# Patient Record
Sex: Male | Born: 1950
Health system: Southern US, Community
[De-identification: ages and names within clinical notes are randomized; demographics above are authoritative.]

## PROBLEM LIST (undated history)

## (undated) DIAGNOSIS — E291 Testicular hypofunction: Secondary | ICD-10-CM

## (undated) DIAGNOSIS — R319 Hematuria, unspecified: Secondary | ICD-10-CM

## (undated) DIAGNOSIS — N419 Inflammatory disease of prostate, unspecified: Secondary | ICD-10-CM

## (undated) DIAGNOSIS — K649 Unspecified hemorrhoids: Secondary | ICD-10-CM

## (undated) DIAGNOSIS — R972 Elevated prostate specific antigen [PSA]: Secondary | ICD-10-CM

## (undated) DIAGNOSIS — N400 Enlarged prostate without lower urinary tract symptoms: Secondary | ICD-10-CM

## (undated) DIAGNOSIS — N529 Male erectile dysfunction, unspecified: Secondary | ICD-10-CM

## (undated) DIAGNOSIS — E785 Hyperlipidemia, unspecified: Secondary | ICD-10-CM

## (undated) DIAGNOSIS — M545 Low back pain, unspecified: Secondary | ICD-10-CM

## (undated) DIAGNOSIS — R361 Hematospermia: Secondary | ICD-10-CM

## (undated) HISTORY — DX: Hyperlipidemia, unspecified: E78.5

## (undated) HISTORY — DX: Low back pain: M54.5

## (undated) HISTORY — DX: Hematuria, unspecified: R31.9

## (undated) HISTORY — DX: Hematospermia: R36.1

## (undated) HISTORY — DX: Unspecified hemorrhoids: K64.9

## (undated) HISTORY — PX: OTHER SURGICAL HISTORY: SHX169

## (undated) HISTORY — DX: Elevated prostate specific antigen (PSA): R97.20

## (undated) HISTORY — DX: Benign prostatic hyperplasia without lower urinary tract symptoms: N40.0

## (undated) HISTORY — DX: Testicular hypofunction: E29.1

## (undated) HISTORY — DX: Male erectile dysfunction, unspecified: N52.9

## (undated) HISTORY — DX: Low back pain, unspecified: M54.50

## (undated) HISTORY — PX: VASECTOMY: SHX75

## (undated) HISTORY — DX: Inflammatory disease of prostate, unspecified: N41.9

---

## 2007-04-08 ENCOUNTER — Emergency Department: Payer: Self-pay | Admitting: Unknown Physician Specialty

## 2007-04-08 ENCOUNTER — Other Ambulatory Visit: Payer: Self-pay

## 2007-04-08 ENCOUNTER — Ambulatory Visit: Payer: Self-pay | Admitting: Internal Medicine

## 2012-01-14 ENCOUNTER — Ambulatory Visit: Payer: Self-pay | Admitting: Cardiology

## 2012-02-05 DIAGNOSIS — E7800 Pure hypercholesterolemia, unspecified: Secondary | ICD-10-CM | POA: Insufficient documentation

## 2012-02-21 ENCOUNTER — Ambulatory Visit: Payer: Self-pay | Admitting: Cardiology

## 2014-03-25 ENCOUNTER — Ambulatory Visit: Payer: Self-pay | Admitting: Urology

## 2014-08-23 DIAGNOSIS — I4891 Unspecified atrial fibrillation: Secondary | ICD-10-CM | POA: Insufficient documentation

## 2015-05-09 DIAGNOSIS — H269 Unspecified cataract: Secondary | ICD-10-CM | POA: Diagnosis not present

## 2015-06-01 DIAGNOSIS — D2261 Melanocytic nevi of right upper limb, including shoulder: Secondary | ICD-10-CM | POA: Diagnosis not present

## 2015-06-01 DIAGNOSIS — L820 Inflamed seborrheic keratosis: Secondary | ICD-10-CM | POA: Diagnosis not present

## 2015-06-01 DIAGNOSIS — D225 Melanocytic nevi of trunk: Secondary | ICD-10-CM | POA: Diagnosis not present

## 2015-06-01 DIAGNOSIS — D2271 Melanocytic nevi of right lower limb, including hip: Secondary | ICD-10-CM | POA: Diagnosis not present

## 2015-06-01 DIAGNOSIS — L570 Actinic keratosis: Secondary | ICD-10-CM | POA: Diagnosis not present

## 2015-06-01 DIAGNOSIS — D2272 Melanocytic nevi of left lower limb, including hip: Secondary | ICD-10-CM | POA: Diagnosis not present

## 2015-06-01 DIAGNOSIS — D485 Neoplasm of uncertain behavior of skin: Secondary | ICD-10-CM | POA: Diagnosis not present

## 2015-07-17 DIAGNOSIS — I480 Paroxysmal atrial fibrillation: Secondary | ICD-10-CM | POA: Diagnosis not present

## 2015-07-17 DIAGNOSIS — E782 Mixed hyperlipidemia: Secondary | ICD-10-CM | POA: Diagnosis not present

## 2015-08-04 ENCOUNTER — Encounter: Payer: Self-pay | Admitting: *Deleted

## 2015-08-08 ENCOUNTER — Ambulatory Visit: Payer: Self-pay | Admitting: Urology

## 2015-08-10 ENCOUNTER — Encounter: Payer: Self-pay | Admitting: Urology

## 2015-08-10 ENCOUNTER — Ambulatory Visit (INDEPENDENT_AMBULATORY_CARE_PROVIDER_SITE_OTHER): Payer: Medicare Other | Admitting: Urology

## 2015-08-10 VITALS — BP 148/77 | HR 80 | Ht 70.0 in | Wt 192.2 lb

## 2015-08-10 DIAGNOSIS — R972 Elevated prostate specific antigen [PSA]: Secondary | ICD-10-CM | POA: Diagnosis not present

## 2015-08-10 DIAGNOSIS — N528 Other male erectile dysfunction: Secondary | ICD-10-CM

## 2015-08-10 DIAGNOSIS — N138 Other obstructive and reflux uropathy: Secondary | ICD-10-CM

## 2015-08-10 DIAGNOSIS — N529 Male erectile dysfunction, unspecified: Secondary | ICD-10-CM

## 2015-08-10 DIAGNOSIS — N401 Enlarged prostate with lower urinary tract symptoms: Secondary | ICD-10-CM | POA: Diagnosis not present

## 2015-08-10 MED ORDER — SILDENAFIL CITRATE 100 MG PO TABS: 100.0000 mg | ORAL_TABLET | Freq: Every day | ORAL | Status: DC | PRN

## 2015-08-10 NOTE — Progress Notes (Signed)
08/10/2015 12:15 PM   Kyle [name] 31-Oct-1950 [id number]  Referring provider: No referring provider defined for this encounter.  Chief Complaint  Patient presents with  . Erectile Dysfunction    yearly f/u    HPI: Patient is 65 year old Caucasian male with a history of erectile dysfunction and BPH with LUTS who presents today for yearly follow-up.  Erectile dysfunction His SHIM score is 10, which is moderate erectile dysfunction.   He has been having difficulty with erections for the last three years.   His major complaint is achieving an erection.  His libido is preserved.   His risk factors for ED are age, BPH and HTN.  He denies any painful erections or curvatures with his erections.   He has tried Viagra in the past with good results.  He would like to continue the medication.      SHIM      08/10/15 1450       SHIM: Over the last 6 months:   How do you rate your confidence that you could get and keep an erection? Very Low     When you had erections with sexual stimulation, how often were your erections hard enough for penetration (entering your partner)? A Few Times (much less than half the time)     During sexual intercourse, how often were you able to maintain your erection after you had penetrated (entered) your partner? Very Difficult     During sexual intercourse, how difficult was it to maintain your erection to completion of intercourse? Difficult     When you attempted sexual intercourse, how often was it satisfactory for you? Very Difficult     SHIM Total Score   SHIM 10        Score: 1-7 Severe ED 8-11 Moderate ED 12-16 Mild-Moderate ED 17-21 Mild ED 22-25 No ED   BPH WITH LUTS His IPSS score today is 1, which is mild lower urinary tract symptomatology. He is delighted with his quality life due to his urinary symptoms.   His major complaint today is urinary frequency, but he attributes this to an increase in water intake.   He has had these  symptoms for the last few months.  He denies any dysuria, hematuria or suprapubic pain.  He also denies any recent fevers, chills, nausea or vomiting.   He does not have a family history of PCa.      IPSS      08/10/15 1400       International Prostate Symptom Score   How often have you had the sensation of not emptying your bladder? Not at All     How often have you had to urinate less than every two hours? Less than 1 in 5 times     How often have you found you stopped and started again several times when you urinated? Not at All     How often have you found it difficult to postpone urination? Not at All     How often have you had a weak urinary stream? Not at All     How often have you had to strain to start urination? Not at All     How many times did you typically get up at night to urinate? None     Total IPSS Score 1     Quality of Life due to urinary symptoms   If you were to spend the rest of your life with your urinary condition just the way  it is now how would you feel about that? Delighted        Score:  1-7 Mild 8-19 Moderate 20-35 Severe    PMH: Past Medical History  Diagnosis Date  . HLD (hyperlipidemia)   . Hematuria   . Elevated PSA   . BPH (benign prostatic hyperplasia)   . Hemorrhoids   . ED (erectile dysfunction)   . Blood in semen   . Hypogonadism in male   . Prostatitis   . Back pain, lumbosacral     Surgical History: Past Surgical History  Procedure Laterality Date  . Cardiaac ablation    . Vasectomy      Home Medications:    Medication List       This list is accurate as of: 08/10/15 11:59 PM.  Always use your most recent med list.               aspirin EC 81 MG tablet  Take by mouth.     lovastatin 10 MG tablet  Commonly known as:  MEVACOR  Take by mouth.     metoprolol tartrate 25 MG tablet  Commonly known as:  LOPRESSOR  Take by mouth.     sildenafil 100 MG tablet  Commonly known as:  VIAGRA  Take 1 tablet (100 mg  total) by mouth daily as needed for erectile dysfunction.        Allergies: No Known Allergies  Family History: Family History  Problem Relation Age of Onset  . Benign prostatic hyperplasia Father   . Breast cancer Mother   . Prostate cancer Neg Hx     Social History:  reports that he has never smoked. He does not have any smokeless tobacco history on file. He reports that he does not drink alcohol or use illicit drugs.  ROS: UROLOGY Frequent Urination?: No Hard to postpone urination?: No Burning/pain with urination?: No Get up at night to urinate?: No Leakage of urine?: No Urine stream starts and stops?: No Trouble starting stream?: No Do you have to strain to urinate?: No Blood in urine?: No Urinary tract infection?: No Sexually transmitted disease?: No Injury to kidneys or bladder?: No Painful intercourse?: No Weak stream?: No Erection problems?: No Penile pain?: No  Gastrointestinal Nausea?: No Vomiting?: No Indigestion/heartburn?: No Diarrhea?: No Constipation?: No  Constitutional Fever: No Night sweats?: No Weight loss?: No Fatigue?: No  Skin Skin rash/lesions?: No Itching?: No  Eyes Blurred vision?: No Double vision?: No  Ears/Nose/Throat Sore throat?: No Sinus problems?: No  Hematologic/Lymphatic Swollen glands?: No Easy bruising?: No  Cardiovascular Leg swelling?: No Chest pain?: No  Respiratory Cough?: No Shortness of breath?: No  Endocrine Excessive thirst?: No  Musculoskeletal Back pain?: No Joint pain?: No  Neurological Headaches?: No Dizziness?: No  Psychologic Depression?: No Anxiety?: No  Physical Exam: BP 148/77 mmHg  Pulse 80  Ht 5\' 10"  (1.778 m)  Wt 192 lb 3.2 oz (87.181 kg)  BMI 27.58 kg/m2  Constitutional: Well nourished. Alert and oriented, No acute distress. HEENT: [place] AT, moist mucus membranes. Trachea midline, no masses. Cardiovascular: No clubbing, cyanosis, or edema. Respiratory: Normal  respiratory effort, no increased work of breathing. GI: Abdomen is soft, non tender, non distended, no abdominal masses. Liver and spleen not palpable.  No hernias appreciated.  Stool sample for occult testing is not indicated.   GU: No CVA tenderness.  No bladder fullness or masses.  Patient with uncircumcised phallus. Foreskin not easily retracted  Urethral meatus is patent.  No  penile discharge. No penile lesions or rashes. Scrotum without lesions, cysts, rashes and/or edema.  Testicles are located scrotally bilaterally. No masses are appreciated in the testicles. Left and right epididymis are normal. Rectal: Patient with  normal sphincter tone. Anus and perineum without scarring or rashes. No rectal masses are appreciated. Prostate is approximately 60 grams, no nodules are appreciated. Seminal vesicles are normal. Skin: No rashes, bruises or suspicious lesions. Lymph: No cervical or inguinal adenopathy. Neurologic: Grossly intact, no focal deficits, moving all 4 extremities. Psychiatric: Normal mood and affect.  Laboratory Data: PSA History  1.4 ng/mL on 05/22/2012  1.5 ng/mL on 06/16/2013  1.6 ng/mL on 02/22/2014  1.7 ng/mL on 08/30/2014   Assessment & Plan:    1. BPH with LUTS:   IPSS score is 1/0.  We will continue to monitor.  He will RTC in one year for IPSS score, exam and PSA.  - PSA  2. Erectile dysfunction of organic origin:    SHIM score is 10.  He has had good success with the medication in the past.  I have given him Viagra 100 mg samples.  If they are still effective, he will call for a prescription.  I have also given him a coupon to help offset the cost.  He will RTC in one year for a SHIM score and exam.    Return in about 1 year (around 08/09/2016) for IPSS score and exam.  These notes generated with voice recognition software. I apologize for typographical errors.  Zara Council, [facility] [address], [place], [place] [address] [phone]

## 2015-08-11 ENCOUNTER — Telehealth: Payer: Self-pay

## 2015-08-11 DIAGNOSIS — N401 Enlarged prostate with lower urinary tract symptoms: Principal | ICD-10-CM

## 2015-08-11 DIAGNOSIS — N138 Other obstructive and reflux uropathy: Secondary | ICD-10-CM | POA: Insufficient documentation

## 2015-08-11 LAB — PSA: PROSTATE SPECIFIC AG, SERUM: 2.1 ng/mL (ref 0.0–4.0)

## 2015-08-11 NOTE — Telephone Encounter (Signed)
-----   Message from Nori Riis, PA-C sent at 08/11/2015  9:21 AM EST ----- PSA is stable.  We will see him in one year.

## 2015-08-11 NOTE — Telephone Encounter (Signed)
Spoke with pt and made aware of PSA results. Pt voiced understanding.  

## 2015-08-31 ENCOUNTER — Ambulatory Visit: Payer: Self-pay | Admitting: Urology

## 2016-05-09 DIAGNOSIS — Z23 Encounter for immunization: Secondary | ICD-10-CM | POA: Diagnosis not present

## 2016-05-31 DIAGNOSIS — D1801 Hemangioma of skin and subcutaneous tissue: Secondary | ICD-10-CM | POA: Diagnosis not present

## 2016-07-17 DIAGNOSIS — I4891 Unspecified atrial fibrillation: Secondary | ICD-10-CM | POA: Diagnosis not present

## 2016-07-17 DIAGNOSIS — I480 Paroxysmal atrial fibrillation: Secondary | ICD-10-CM | POA: Diagnosis not present

## 2016-07-17 DIAGNOSIS — E7800 Pure hypercholesterolemia, unspecified: Secondary | ICD-10-CM | POA: Diagnosis not present

## 2016-07-17 DIAGNOSIS — K649 Unspecified hemorrhoids: Secondary | ICD-10-CM | POA: Diagnosis not present

## 2016-08-05 ENCOUNTER — Other Ambulatory Visit: Payer: Self-pay

## 2016-08-05 ENCOUNTER — Other Ambulatory Visit: Payer: Medicare Other

## 2016-08-05 DIAGNOSIS — N401 Enlarged prostate with lower urinary tract symptoms: Secondary | ICD-10-CM

## 2016-08-06 LAB — PSA: Prostate Specific Ag, Serum: 1.6 ng/mL (ref 0.0–4.0)

## 2016-08-12 ENCOUNTER — Ambulatory Visit (INDEPENDENT_AMBULATORY_CARE_PROVIDER_SITE_OTHER): Payer: Medicare Other | Admitting: Urology

## 2016-08-12 ENCOUNTER — Encounter: Payer: Self-pay | Admitting: Urology

## 2016-08-12 VITALS — BP 149/78 | HR 69 | Ht 70.0 in | Wt 187.9 lb

## 2016-08-12 DIAGNOSIS — N401 Enlarged prostate with lower urinary tract symptoms: Secondary | ICD-10-CM | POA: Diagnosis not present

## 2016-08-12 DIAGNOSIS — N529 Male erectile dysfunction, unspecified: Secondary | ICD-10-CM | POA: Diagnosis not present

## 2016-08-12 DIAGNOSIS — N138 Other obstructive and reflux uropathy: Secondary | ICD-10-CM

## 2016-08-12 MED ORDER — SILDENAFIL CITRATE 100 MG PO TABS: 100.0000 mg | ORAL_TABLET | Freq: Every day | ORAL | 12 refills | Status: DC | PRN

## 2016-08-12 NOTE — Progress Notes (Signed)
08/12/2016 1:56 PM   Kyle [name] 05-Sep-1950 [id number]  Referring provider: Sherrin Daisy, MD [place], [place] [id number]  Chief Complaint  Patient presents with  . Benign Prostatic Hypertrophy    1 year follow up   . Erectile Dysfunction    HPI: Patient is 66 year old Caucasian male with erectile dysfunction and BPH with LUTS who presents today for yearly follow-up.  Erectile dysfunction His SHIM score is 18, which is mild erectile dysfunction.   His previous SHIM score was 10.  He has been having difficulty with erections for the last three years.   His major complaint is achieving an erection.  His libido is preserved.   His risk factors for ED are age, BPH and HTN.  He denies any painful erections or curvatures with his erections.   He has tried Viagra in the past with good results.  He would like to continue the medication.      SHIM    Row Name 08/12/16 1345         SHIM: Over the last 6 months:   How do you rate your confidence that you could get and keep an erection? Low     When you had erections with sexual stimulation, how often were your erections hard enough for penetration (entering your partner)? Most Times (much more than half the time)     During sexual intercourse, how often were you able to maintain your erection after you had penetrated (entered) your partner? Slightly Difficult     During sexual intercourse, how difficult was it to maintain your erection to completion of intercourse? Slightly Difficult     When you attempted sexual intercourse, how often was it satisfactory for you? Slightly Difficult       SHIM Total Score   SHIM 18        Score: 1-7 Severe ED 8-11 Moderate ED 12-16 Mild-Moderate ED 17-21 Mild ED 22-25 No ED   BPH WITH LUTS His IPSS score today is 0, which is no lower urinary tract symptomatology. He is delighted with his quality life due to his urinary symptoms.  His previous I PSS score was 1/0.  He has  no urinary complaints at this time.  He denies any dysuria, hematuria or suprapubic pain.  He also denies any recent fevers, chills, nausea or vomiting.   He does not have a family history of PCa.      IPSS    Row Name 08/12/16 1300         International Prostate Symptom Score   How often have you had the sensation of not emptying your bladder? Not at All     How often have you had to urinate less than every two hours? Not at All     How often have you found you stopped and started again several times when you urinated? Not at All     How often have you found it difficult to postpone urination? Not at All     How often have you had a weak urinary stream? Not at All     How often have you had to strain to start urination? Not at All     How many times did you typically get up at night to urinate? None     Total IPSS Score 0       Quality of Life due to urinary symptoms   If you were to spend the rest of your life with your  urinary condition just the way it is now how would you feel about that? Delighted        Score:  1-7 Mild 8-19 Moderate 20-35 Severe    PMH: Past Medical History:  Diagnosis Date  . Back pain, lumbosacral   . Blood in semen   . BPH (benign prostatic hyperplasia)   . ED (erectile dysfunction)   . Elevated PSA   . Hematuria   . Hemorrhoids   . HLD (hyperlipidemia)   . Hypogonadism in male   . Prostatitis     Surgical History: Past Surgical History:  Procedure Laterality Date  . cardiaac ablation    . VASECTOMY      Home Medications:  Allergies as of 08/12/2016   No Known Allergies     Medication List       Accurate as of 08/12/16  1:56 PM. Always use your most recent med list.          aspirin EC 81 MG tablet Take by mouth.   lovastatin 10 MG tablet Commonly known as:  MEVACOR Take by mouth.   lovastatin 10 MG tablet Commonly known as:  MEVACOR Take by mouth.   metoprolol tartrate 25 MG tablet Commonly known as:   LOPRESSOR Take by mouth.   sildenafil 100 MG tablet Commonly known as:  VIAGRA Take 1 tablet (100 mg total) by mouth daily as needed for erectile dysfunction.       Allergies: No Known Allergies  Family History: Family History  Problem Relation Age of Onset  . Benign prostatic hyperplasia Father   . Breast cancer Mother   . Prostate cancer Neg Hx   . Kidney disease Neg Hx   . Bladder Cancer Neg Hx     Social History:  reports that he has never smoked. He has never used smokeless tobacco. He reports that he does not drink alcohol or use drugs.  ROS: UROLOGY Frequent Urination?: No Hard to postpone urination?: No Burning/pain with urination?: No Get up at night to urinate?: No Leakage of urine?: No Urine stream starts and stops?: No Trouble starting stream?: No Do you have to strain to urinate?: No Blood in urine?: No Urinary tract infection?: No Sexually transmitted disease?: No Injury to kidneys or bladder?: No Painful intercourse?: No Weak stream?: No Erection problems?: Yes Penile pain?: No  Gastrointestinal Nausea?: No Vomiting?: No Indigestion/heartburn?: No Diarrhea?: No Constipation?: No  Constitutional Fever: No Night sweats?: No Weight loss?: No Fatigue?: No  Skin Skin rash/lesions?: No Itching?: No  Eyes Blurred vision?: No Double vision?: No  Ears/Nose/Throat Sore throat?: No Sinus problems?: No  Hematologic/Lymphatic Swollen glands?: No Easy bruising?: No  Cardiovascular Leg swelling?: No Chest pain?: No  Respiratory Cough?: No Shortness of breath?: No  Endocrine Excessive thirst?: No  Musculoskeletal Back pain?: No Joint pain?: No  Neurological Headaches?: No Dizziness?: No  Psychologic Depression?: No Anxiety?: No  Physical Exam: BP (!) 149/78   Pulse 69   Ht 5\' 10"  (1.778 m)   Wt 187 lb 14.4 oz (85.2 kg)   BMI 26.96 kg/m   Constitutional: Well nourished. Alert and oriented, No acute distress. HEENT:  [place] AT, moist mucus membranes. Trachea midline, no masses. Cardiovascular: No clubbing, cyanosis, or edema. Respiratory: Normal respiratory effort, no increased work of breathing. GI: Abdomen is soft, non tender, non distended, no abdominal masses. Liver and spleen not palpable.  No hernias appreciated.  Stool sample for occult testing is not indicated.   GU: No CVA tenderness.  No bladder fullness or masses.  Patient with uncircumcised phallus. Foreskin not easily retracted  Urethral meatus is patent.  No penile discharge. No penile lesions or rashes. Scrotum without lesions, cysts, rashes and/or edema.  Testicles are located scrotally bilaterally. No masses are appreciated in the testicles. Left and right epididymis are normal. Rectal: Patient with  normal sphincter tone. Anus and perineum without scarring or rashes. No rectal masses are appreciated. Prostate is approximately 60 grams, no nodules are appreciated. Seminal vesicles are normal. Skin: No rashes, bruises or suspicious lesions. Lymph: No cervical or inguinal adenopathy. Neurologic: Grossly intact, no focal deficits, moving all 4 extremities. Psychiatric: Normal mood and affect.  Laboratory Data: PSA History  1.4 ng/mL on 05/22/2012  1.5 ng/mL on 06/16/2013  1.6 ng/mL on 02/22/2014  1.7 ng/mL on 08/30/2014  2.1 ng/mL on 08/10/2015  1.6 ng/mL on 08/05/2016   Assessment & Plan:    1. BPH with LUTS  - IPSS score is 0/0, it is improving  - Continue conservative management, avoiding bladder irritants and timed voiding's  - RTC in 12 months for IPSS, PSA and exam   2. Erectile dysfunction of organic origin:    SHIM score is 18, it is improving   Good success with Viagra.  Script called to pharmacy.  He will RTC in one year for a SHIM score and exam.    Return in about 1 year (around 08/12/2017) for IPSS, SHIM, PSA and exam.  These notes generated with voice recognition software. I apologize for typographical errors.  Zara Council, [facility] [address] [place], [place] [address] [phone]

## 2016-08-12 NOTE — Progress Notes (Signed)
bph

## 2017-05-07 DIAGNOSIS — Z23 Encounter for immunization: Secondary | ICD-10-CM | POA: Diagnosis not present

## 2017-05-30 DIAGNOSIS — D2271 Melanocytic nevi of right lower limb, including hip: Secondary | ICD-10-CM | POA: Diagnosis not present

## 2017-05-30 DIAGNOSIS — D225 Melanocytic nevi of trunk: Secondary | ICD-10-CM | POA: Diagnosis not present

## 2017-05-30 DIAGNOSIS — D2262 Melanocytic nevi of left upper limb, including shoulder: Secondary | ICD-10-CM | POA: Diagnosis not present

## 2017-05-30 DIAGNOSIS — L218 Other seborrheic dermatitis: Secondary | ICD-10-CM | POA: Diagnosis not present

## 2017-07-02 DIAGNOSIS — Z9989 Dependence on other enabling machines and devices: Secondary | ICD-10-CM | POA: Diagnosis not present

## 2017-07-02 DIAGNOSIS — G4733 Obstructive sleep apnea (adult) (pediatric): Secondary | ICD-10-CM | POA: Diagnosis not present

## 2017-07-02 DIAGNOSIS — E7849 Other hyperlipidemia: Secondary | ICD-10-CM | POA: Diagnosis not present

## 2017-07-02 DIAGNOSIS — E7800 Pure hypercholesterolemia, unspecified: Secondary | ICD-10-CM | POA: Diagnosis not present

## 2017-07-02 DIAGNOSIS — I480 Paroxysmal atrial fibrillation: Secondary | ICD-10-CM | POA: Diagnosis not present

## 2017-07-02 DIAGNOSIS — I4891 Unspecified atrial fibrillation: Secondary | ICD-10-CM | POA: Diagnosis not present

## 2017-08-12 ENCOUNTER — Ambulatory Visit: Payer: Medicare Other | Admitting: Urology

## 2017-08-13 ENCOUNTER — Other Ambulatory Visit: Payer: Medicare Other

## 2017-08-13 DIAGNOSIS — N401 Enlarged prostate with lower urinary tract symptoms: Secondary | ICD-10-CM

## 2017-08-14 ENCOUNTER — Other Ambulatory Visit: Payer: Medicare Other

## 2017-08-14 LAB — PSA: Prostate Specific Ag, Serum: 1.9 ng/mL (ref 0.0–4.0)

## 2017-08-17 NOTE — Progress Notes (Signed)
08/18/2017 11:56 PM   Kyle [name] Aug 21, 1950 [id number]  Referring provider: Sherrin Daisy, MD No address on file  Chief Complaint  Patient presents with  . Benign Prostatic Hypertrophy    HPI: Patient is 67 year old Caucasian male with erectile dysfunction and BPH with LUTS who presents today for yearly follow-up.  Erectile dysfunction His SHIM score is 13, which is mild to moderate erectile dysfunction.   His previous SHIM score was 18.  He has been having difficulty with erections for the last four years.   His major complaint is achieving an erection.  His libido is preserved.   His risk factors for ED are age, BPH and HTN.  He denies any painful erections or curvatures with his erections.   He has noted that over the last 6 months there has been a decrease in the efficacy of the Viagra.  He does have sleep apnea and is sleeping with a CPAP machine.  SHIM    Row Name 08/18/17 1447         SHIM: Over the last 6 months:   How do you rate your confidence that you could get and keep an erection?  Low     When you had erections with sexual stimulation, how often were your erections hard enough for penetration (entering your partner)?  Sometimes (about half the time)     During sexual intercourse, how often were you able to maintain your erection after you had penetrated (entered) your partner?  Almost Always or Always     During sexual intercourse, how difficult was it to maintain your erection to completion of intercourse?  Not Difficult     When you attempted sexual intercourse, how often was it satisfactory for you?  Sometimes (about half the time)       SHIM Total Score   SHIM  18        Score: 1-7 Severe ED 8-11 Moderate ED 12-16 Mild-Moderate ED 17-21 Mild ED 22-25 No ED   BPH WITH LUTS His IPSS score today is 1, which is mild lower urinary tract symptomatology. He is delighted with his quality life due to his urinary symptoms.  His previous I PSS score  was 0/0.  He has no urinary complaints [name] this time.  He denies any dysuria, hematuria or suprapubic pain.  He also denies any recent fevers, chills, nausea or vomiting.   He does not have a family history of PCa.  IPSS    Row Name 08/18/17 1400         International Prostate Symptom Score   How often have you had the sensation of not emptying your bladder?  Not [name] All     How often have you had to urinate less than every two hours?  Not [name] All     How often have you found you stopped and started again several times when you urinated?  Not [name] All     How often have you found it difficult to postpone urination?  Not [name] All     How often have you had a weak urinary stream?  Not [name] All     How often have you had to strain to start urination?  Not [name] All     How many times did you typically get up [name] night to urinate?  1 Time     Total IPSS Score  1       Quality of Life due to urinary symptoms   If  you were to spend the rest of your life with your urinary condition just the way it is now how would you feel about that?  Delighted        Score:  1-7 Mild 8-19 Moderate 20-35 Severe    PMH: Past Medical History:  Diagnosis Date  . Back pain, lumbosacral   . Blood in semen   . BPH (benign prostatic hyperplasia)   . ED (erectile dysfunction)   . Elevated PSA   . Hematuria   . Hemorrhoids   . HLD (hyperlipidemia)   . Hypogonadism in male   . Prostatitis     Surgical History: Past Surgical History:  Procedure Laterality Date  . cardiaac ablation    . VASECTOMY      Home Medications:  Allergies as of 08/18/2017   No Known Allergies     Medication List        Accurate as of 08/18/17 11:59 PM. Always use your most recent med list.          aspirin EC 81 MG tablet Take by mouth.   lovastatin 10 MG tablet Commonly known as:  MEVACOR Take by mouth.   lovastatin 10 MG tablet Commonly known as:  MEVACOR Take by mouth.   metoprolol tartrate 25 MG tablet Commonly  known as:  LOPRESSOR Take by mouth.   sildenafil 100 MG tablet Commonly known as:  VIAGRA Take 1 tablet (100 mg total) by mouth daily as needed for erectile dysfunction.   tadalafil 20 MG tablet Commonly known as:  CIALIS Take 1 tablet (20 mg total) by mouth daily as needed for erectile dysfunction.       Allergies: No Known Allergies  Family History: Family History  Problem Relation Age of Onset  . Benign prostatic hyperplasia Father   . Breast cancer Mother   . Prostate cancer Neg Hx   . Kidney disease Neg Hx   . Bladder Cancer Neg Hx     Social History:  reports that  has never smoked. he has never used smokeless tobacco. He reports that he does not drink alcohol or use drugs.  ROS: UROLOGY Frequent Urination?: No Hard to postpone urination?: No Burning/pain with urination?: No Get up [name] night to urinate?: No Leakage of urine?: No Urine stream starts and stops?: No Trouble starting stream?: No Do you have to strain to urinate?: No Blood in urine?: No Urinary tract infection?: No Sexually transmitted disease?: No Injury to kidneys or bladder?: No Painful intercourse?: No Weak stream?: No Erection problems?: Yes Penile pain?: No  Gastrointestinal Nausea?: No Vomiting?: No Indigestion/heartburn?: No Diarrhea?: No Constipation?: No  Constitutional Fever: No Night sweats?: No Weight loss?: No Fatigue?: No  Skin Skin rash/lesions?: No Itching?: No  Eyes Blurred vision?: No Double vision?: No  Ears/Nose/Throat Sore throat?: No Sinus problems?: No  Hematologic/Lymphatic Swollen glands?: No Easy bruising?: No  Cardiovascular Leg swelling?: No Chest pain?: No  Respiratory Cough?: No Shortness of breath?: No  Endocrine Excessive thirst?: No  Musculoskeletal Back pain?: No Joint pain?: No  Neurological Headaches?: No Dizziness?: No  Psychologic Depression?: No Anxiety?: No  Physical Exam: BP (!) 145/80 (BP Location: Right Arm,  Patient Position: Sitting, Cuff Size: Normal)   Pulse 75   Wt 190 lb 3.2 oz (86.3 kg)   BMI 27.29 kg/m   Constitutional: Well nourished. Alert and oriented, No acute distress. HEENT: Kyle [name], moist mucus membranes. Trachea midline, no masses. Cardiovascular: No clubbing, cyanosis, or edema. Respiratory: Normal respiratory effort,  no increased work of breathing. GI: Abdomen is soft, non tender, non distended, no abdominal masses. Liver and spleen not palpable.  No hernias appreciated.  Stool sample for occult testing is not indicated.   GU: No CVA tenderness.  No bladder fullness or masses.  Patient with uncircumcised phallus.  Foreskin easily retracted Urethral meatus is patent.  No penile discharge. No penile lesions or rashes. Scrotum without lesions, cysts, rashes and/or edema.  Testicles are located scrotally bilaterally. No masses are appreciated in the testicles. Left and right epididymis are normal. Rectal: Patient with  normal sphincter tone. Anus and perineum without scarring or rashes. No rectal masses are appreciated. Prostate is approximately 60 grams, no nodules are appreciated. Seminal vesicles are normal. Skin: No rashes, bruises or suspicious lesions. Lymph: No cervical or inguinal adenopathy. Neurologic: Grossly intact, no focal deficits, moving all 4 extremities. Psychiatric: Normal mood and affect.   Laboratory Data: PSA History  1.4 ng/mL on 05/22/2012  1.5 ng/mL on 06/16/2013  1.6 ng/mL on 02/22/2014  1.7 ng/mL on 08/30/2014  Component     Latest Ref Rng & Units 08/10/2015 08/05/2016 08/13/2017  Prostate Specific Ag, Serum     0.0 - 4.0 ng/mL 2.1 1.6 1.9   I have reviewed the labs.  Assessment & Plan:    1. BPH with LUTS  - IPSS score is 1/0, it is slightly worse  - Continue conservative management, avoiding bladder irritants and timed voiding's  - RTC in 12 months for IPSS, PSA and exam   2. Erectile dysfunction of organic origin:      - SHIM score is 13, it  is worsening  - I explained to the patient that in order to achieve an erection it takes good functioning of the nervous system (parasympathetic and rs, sympathetic, sensory and motor), good blood flow into the erectile tissue of the penis and a desire to have sex  - we will obtain a serum testosterone level [name] this time; if it is abnormal we will need to repeat the study for confirmation  - A recent study published in Sex Med 2018 Apr 13 revealed moderate to vigorous aerobic exercise for 40 minutes 4 times per week can decrease erectile problems caused by physical inactivity, obesity, hypertension, metabolic syndrome and/or cardiovascular diseases  - We discussed trying a different PDE5 inhibitor, intra-urethral suppositories, intracavernous vasoactive drug injection therapy, vacuum constriction device and penile prosthesis implantation  - He would like to try Cialis -prescription sent for Cialis to his pharmacy  - RTC in 12 months for repeat SHIM score and exam   Return in about 1 year (around 08/18/2018) for IPSS, SHIM, PSA and exam.  These notes generated with voice recognition software. I apologize for typographical errors.  Zara Council, [facility] [address], [place], [place] [address] [phone]

## 2017-08-18 ENCOUNTER — Encounter: Payer: Self-pay | Admitting: Urology

## 2017-08-18 ENCOUNTER — Ambulatory Visit (INDEPENDENT_AMBULATORY_CARE_PROVIDER_SITE_OTHER): Payer: Medicare Other | Admitting: Urology

## 2017-08-18 VITALS — BP 145/80 | HR 75 | Wt 190.2 lb

## 2017-08-18 DIAGNOSIS — N401 Enlarged prostate with lower urinary tract symptoms: Secondary | ICD-10-CM

## 2017-08-18 DIAGNOSIS — N138 Other obstructive and reflux uropathy: Secondary | ICD-10-CM

## 2017-08-18 DIAGNOSIS — N529 Male erectile dysfunction, unspecified: Secondary | ICD-10-CM | POA: Diagnosis not present

## 2017-08-18 MED ORDER — TADALAFIL 20 MG PO TABS: 20.0000 mg | ORAL_TABLET | Freq: Every day | ORAL | 11 refills | Status: DC | PRN

## 2017-08-19 ENCOUNTER — Encounter: Payer: Self-pay | Admitting: Urology

## 2017-08-19 LAB — TESTOSTERONE: Testosterone: 390 ng/dL (ref 264–916)

## 2017-11-11 DIAGNOSIS — Z79899 Other long term (current) drug therapy: Secondary | ICD-10-CM | POA: Diagnosis not present

## 2017-11-11 DIAGNOSIS — Z9989 Dependence on other enabling machines and devices: Secondary | ICD-10-CM | POA: Diagnosis not present

## 2017-11-11 DIAGNOSIS — G4733 Obstructive sleep apnea (adult) (pediatric): Secondary | ICD-10-CM | POA: Diagnosis not present

## 2017-11-11 DIAGNOSIS — E7800 Pure hypercholesterolemia, unspecified: Secondary | ICD-10-CM | POA: Diagnosis not present

## 2017-11-11 DIAGNOSIS — Z23 Encounter for immunization: Secondary | ICD-10-CM | POA: Diagnosis not present

## 2017-11-11 DIAGNOSIS — I480 Paroxysmal atrial fibrillation: Secondary | ICD-10-CM | POA: Diagnosis not present

## 2017-11-11 DIAGNOSIS — R030 Elevated blood-pressure reading, without diagnosis of hypertension: Secondary | ICD-10-CM | POA: Diagnosis not present

## 2017-11-11 DIAGNOSIS — Z1159 Encounter for screening for other viral diseases: Secondary | ICD-10-CM | POA: Diagnosis not present

## 2017-11-13 DIAGNOSIS — Z1159 Encounter for screening for other viral diseases: Secondary | ICD-10-CM | POA: Diagnosis not present

## 2017-11-13 DIAGNOSIS — R030 Elevated blood-pressure reading, without diagnosis of hypertension: Secondary | ICD-10-CM | POA: Diagnosis not present

## 2017-11-13 DIAGNOSIS — Z79899 Other long term (current) drug therapy: Secondary | ICD-10-CM | POA: Diagnosis not present

## 2017-11-13 DIAGNOSIS — E7800 Pure hypercholesterolemia, unspecified: Secondary | ICD-10-CM | POA: Diagnosis not present

## 2017-11-17 DIAGNOSIS — R7301 Impaired fasting glucose: Secondary | ICD-10-CM | POA: Insufficient documentation

## 2017-11-17 DIAGNOSIS — D649 Anemia, unspecified: Secondary | ICD-10-CM | POA: Insufficient documentation

## 2018-05-15 DIAGNOSIS — Z23 Encounter for immunization: Secondary | ICD-10-CM | POA: Diagnosis not present

## 2018-05-29 DIAGNOSIS — D2262 Melanocytic nevi of left upper limb, including shoulder: Secondary | ICD-10-CM | POA: Diagnosis not present

## 2018-05-29 DIAGNOSIS — L821 Other seborrheic keratosis: Secondary | ICD-10-CM | POA: Diagnosis not present

## 2018-05-29 DIAGNOSIS — D2271 Melanocytic nevi of right lower limb, including hip: Secondary | ICD-10-CM | POA: Diagnosis not present

## 2018-05-29 DIAGNOSIS — D2261 Melanocytic nevi of right upper limb, including shoulder: Secondary | ICD-10-CM | POA: Diagnosis not present

## 2018-05-29 DIAGNOSIS — D225 Melanocytic nevi of trunk: Secondary | ICD-10-CM | POA: Diagnosis not present

## 2018-05-29 DIAGNOSIS — L218 Other seborrheic dermatitis: Secondary | ICD-10-CM | POA: Diagnosis not present

## 2018-05-29 DIAGNOSIS — D2272 Melanocytic nevi of left lower limb, including hip: Secondary | ICD-10-CM | POA: Diagnosis not present

## 2018-06-30 DIAGNOSIS — Z9989 Dependence on other enabling machines and devices: Secondary | ICD-10-CM | POA: Diagnosis not present

## 2018-06-30 DIAGNOSIS — E7800 Pure hypercholesterolemia, unspecified: Secondary | ICD-10-CM | POA: Diagnosis not present

## 2018-06-30 DIAGNOSIS — I480 Paroxysmal atrial fibrillation: Secondary | ICD-10-CM | POA: Diagnosis not present

## 2018-06-30 DIAGNOSIS — G4733 Obstructive sleep apnea (adult) (pediatric): Secondary | ICD-10-CM | POA: Diagnosis not present

## 2018-08-17 ENCOUNTER — Other Ambulatory Visit: Payer: Self-pay | Admitting: Family Medicine

## 2018-08-17 DIAGNOSIS — N401 Enlarged prostate with lower urinary tract symptoms: Principal | ICD-10-CM

## 2018-08-17 DIAGNOSIS — N138 Other obstructive and reflux uropathy: Secondary | ICD-10-CM

## 2018-08-19 ENCOUNTER — Other Ambulatory Visit: Payer: Medicare Other

## 2018-08-19 DIAGNOSIS — N138 Other obstructive and reflux uropathy: Secondary | ICD-10-CM

## 2018-08-19 DIAGNOSIS — N401 Enlarged prostate with lower urinary tract symptoms: Principal | ICD-10-CM

## 2018-08-19 NOTE — Progress Notes (Signed)
08/21/2017 11:13 AM   Philomena Course 04-14-1951 [id number]  Referring provider: Sallee Lange, NP [address] [place], [place] [address]  Chief Complaint  Patient presents with  . Benign Prostatic Hypertrophy    HPI: Kyle [name] is a 68 y.o. male Caucasian with erectile dysfunction and BPH with LUTS who presents today for annual follow-up.  Erectile dysfunction His SHIM score is 14, which is mild-moderate ED.   His previous SHIM score was 13.  He has been having difficulty with erections for the last five years.   His major complaint is achieving an erection.  His libido is preserved.  His risk factors for ED are age, BPH, and HTN.  He denies any painful erections or curvatures with his erections.   He is still having spontaneous erections.  He has tried Viagra in the past, but over time its efficacy decreased.  He does have sleep apnea and is sleeping with a CPAP machine.  He had tried sildenafil and tadalafil; tadalafil did not work reliably, sildenafil worked better according to him. SHIM    Row Name 08/21/18 1014         SHIM: Over the last 6 months:   How do you rate your confidence that you could get and keep an erection?  Low     When you had erections with sexual stimulation, how often were your erections hard enough for penetration (entering your partner)?  A Few Times (much less than half the time)     During sexual intercourse, how often were you able to maintain your erection after you had penetrated (entered) your partner?  A Few Times (much less than half the time)     During sexual intercourse, how difficult was it to maintain your erection to completion of intercourse?  Slightly Difficult     When you attempted sexual intercourse, how often was it satisfactory for you?  Most Times (much more than half the time)       SHIM Total Score   SHIM  14        Score: 1-7 Severe ED 8-11 Moderate ED 12-16 Mild-Moderate ED 17-21 Mild ED 22-25 No ED  BPH  WITH LUTS  (prostate and/or bladder) IPSS score: 0/0  Previous score: 1/0   Major complaint(s):  No major complaints today. Denies any dysuria, hematuria or suprapubic pain.   Currently taking: Nothing.   Denies any recent fevers, chills, nausea or vomiting.  He does not have a family history of PCa.  IPSS    Row Name 08/21/18 1000         International Prostate Symptom Score   How often have you had the sensation of not emptying your bladder?  Not at All     How often have you had to urinate less than every two hours?  Not at All     How often have you found you stopped and started again several times when you urinated?  Not at All     How often have you found it difficult to postpone urination?  Not at All     How often have you had a weak urinary stream?  Not at All     How often have you had to strain to start urination?  Not at All     How many times did you typically get up at night to urinate?  None     Total IPSS Score  0       Quality of  Life due to urinary symptoms   If you were to spend the rest of your life with your urinary condition just the way it is now how would you feel about that?  Delighted        Score:  1-7 Mild 8-19 Moderate 20-35 Severe   PMH: Past Medical History:  Diagnosis Date  . Back pain, lumbosacral   . Blood in semen   . BPH (benign prostatic hyperplasia)   . ED (erectile dysfunction)   . Elevated PSA   . Hematuria   . Hemorrhoids   . HLD (hyperlipidemia)   . Hypogonadism in male   . Prostatitis     Surgical History: Past Surgical History:  Procedure Laterality Date  . cardiaac ablation    . VASECTOMY      Home Medications:  Allergies as of 08/21/2018   No Known Allergies     Medication List       Accurate as of August 21, 2018 11:13 AM. Always use your most recent med list.        aspirin EC 81 MG tablet Take by mouth.   lovastatin 10 MG tablet Commonly known as:  MEVACOR Take by mouth.   metoprolol  tartrate 25 MG tablet Commonly known as:  LOPRESSOR Take by mouth.   nystatin cream Commonly known as:  MYCOSTATIN Apply 1 application topically 2 (two) times daily.   sildenafil 20 MG tablet Commonly known as:  REVATIO Take 3 to 5 tablets two hours before intercouse on an empty stomach.  Do not take with nitrates.       Allergies: No Known Allergies  Family History: Family History  Problem Relation Age of Onset  . Benign prostatic hyperplasia Father   . Breast cancer Mother   . Prostate cancer Neg Hx   . Kidney disease Neg Hx   . Bladder Cancer Neg Hx     Social History:  reports that he has never smoked. He has never used smokeless tobacco. He reports that he does not drink alcohol or use drugs.  ROS: UROLOGY Frequent Urination?: No Hard to postpone urination?: No Burning/pain with urination?: No Get up at night to urinate?: No Leakage of urine?: No Urine stream starts and stops?: No Trouble starting stream?: No Do you have to strain to urinate?: No Blood in urine?: No Urinary tract infection?: No Sexually transmitted disease?: No Injury to kidneys or bladder?: No Painful intercourse?: No Weak stream?: No Erection problems?: Yes Penile pain?: No  Gastrointestinal Nausea?: No Vomiting?: No Indigestion/heartburn?: No Diarrhea?: No Constipation?: No  Constitutional Fever: No Night sweats?: No Weight loss?: No Fatigue?: No  Skin Skin rash/lesions?: No Itching?: No  Eyes Blurred vision?: No Double vision?: No  Ears/Nose/Throat Sore throat?: No Sinus problems?: No  Hematologic/Lymphatic Swollen glands?: No Easy bruising?: No  Cardiovascular Leg swelling?: No Chest pain?: No  Respiratory Cough?: No Shortness of breath?: No  Endocrine Excessive thirst?: No  Musculoskeletal Back pain?: No Joint pain?: No  Neurological Headaches?: No Dizziness?: No  Psychologic Depression?: No Anxiety?: No  Physical Exam: BP 127/88   Pulse  83   Ht 5\' 10"  (1.778 m)   Wt 192 lb (87.1 kg)   BMI 27.55 kg/m   Constitutional:  Well nourished. Alert and oriented, No acute distress. Cardiovascular: No clubbing, cyanosis, or edema. Respiratory: Normal respiratory effort, no increased work of breathing. GU: No CVA tenderness.  No bladder fullness or masses.  Patient with uncircumcised phallus.  Foreskin is slightly tight but  does not reportedly interfere with erections.  Balanoposthitis is present.  Urethral meatus is patent.  No penile discharge. No penile lesions or rashes. Scrotum without lesions, cysts, rashes and/or edema. Testicles are located scrotally bilaterally. No masses are appreciated in the testicles. Left and right epididymis are normal. Rectal: Patient with normal sphincter tone. Anus and perineum without scarring or rashes, but hemorrhoids noted. No rectal masses are appreciated. Prostate is approximately 60 grams, no nodules are appreciated. Seminal vesicles could not be palpated. Skin: No rashes, bruises or suspicious lesions. Neurologic: Grossly intact, no focal deficits, moving all 4 extremities. Psychiatric: Normal mood and affect.  Laboratory Data: PSA History  1.4 ng/mL on 05/22/2012  1.5 ng/mL on 06/16/2013  1.6 ng/mL on 02/22/2014  1.7 ng/mL on 08/30/2014  Component     Latest Ref Rng & Units 08/10/2015 08/05/2016 08/13/2017 08/20/2018  Prostate Specific Ag, Serum     0.0 - 4.0 ng/mL 2.1 1.6 1.9 2.2   I have reviewed the labs.  Assessment & Plan:    1. BPH with LUTS - IPSS score is 0/0, it is improved - Continue conservative management, avoiding bladder irritants and timed voiding's - RTC in 12 months for IPSS, PSA and exam   2. Erectile dysfunction of organic origin:     - SHIM score is 14, it is improving - He wishes to return to Viagra, and has been provided with a printed prescription - RTC in 12 months for repeat SHIM score and exam  3. Balanoposthitis - given Nystatin cream to apply twice  daily -recheck in two weeks, explained to the patient that it is important to have it rechecked to ensure it heals as penile cancer may present in a similar fashion   Return in about 2 weeks (around 09/04/2018) for recheck .  These notes generated with voice recognition software. I apologize for typographical errors.  [facility] [address] [place], [place] [address] [phone]  I, Adele Schilder, am acting as a Education administrator for [employer], PA-C.   I have reviewed the above documentation for accuracy and completeness, and I agree with the above.    Zara Council, PA-C

## 2018-08-20 DIAGNOSIS — G4733 Obstructive sleep apnea (adult) (pediatric): Secondary | ICD-10-CM | POA: Insufficient documentation

## 2018-08-20 DIAGNOSIS — K649 Unspecified hemorrhoids: Secondary | ICD-10-CM | POA: Insufficient documentation

## 2018-08-20 DIAGNOSIS — R030 Elevated blood-pressure reading, without diagnosis of hypertension: Secondary | ICD-10-CM | POA: Insufficient documentation

## 2018-08-20 DIAGNOSIS — Z9989 Dependence on other enabling machines and devices: Secondary | ICD-10-CM

## 2018-08-20 DIAGNOSIS — Z973 Presence of spectacles and contact lenses: Secondary | ICD-10-CM | POA: Insufficient documentation

## 2018-08-20 LAB — PSA: PROSTATE SPECIFIC AG, SERUM: 2.2 ng/mL (ref 0.0–4.0)

## 2018-08-21 ENCOUNTER — Encounter: Payer: Self-pay | Admitting: Urology

## 2018-08-21 ENCOUNTER — Ambulatory Visit (INDEPENDENT_AMBULATORY_CARE_PROVIDER_SITE_OTHER): Payer: Medicare Other | Admitting: Urology

## 2018-08-21 VITALS — BP 127/88 | HR 83 | Ht 70.0 in | Wt 192.0 lb

## 2018-08-21 DIAGNOSIS — N401 Enlarged prostate with lower urinary tract symptoms: Secondary | ICD-10-CM

## 2018-08-21 DIAGNOSIS — N476 Balanoposthitis: Secondary | ICD-10-CM | POA: Diagnosis not present

## 2018-08-21 DIAGNOSIS — N529 Male erectile dysfunction, unspecified: Secondary | ICD-10-CM | POA: Diagnosis not present

## 2018-08-21 DIAGNOSIS — N138 Other obstructive and reflux uropathy: Secondary | ICD-10-CM | POA: Diagnosis not present

## 2018-08-21 MED ORDER — NYSTATIN 100000 UNIT/GM EX CREA: 1.0000 "application " | TOPICAL_CREAM | Freq: Two times a day (BID) | CUTANEOUS | 0 refills | Status: AC

## 2018-08-21 MED ORDER — SILDENAFIL CITRATE 20 MG PO TABS: ORAL_TABLET | ORAL | 3 refills | Status: AC

## 2018-09-04 ENCOUNTER — Ambulatory Visit: Payer: Medicare Other | Admitting: Urology

## 2020-04-05 ENCOUNTER — Other Ambulatory Visit (HOSPITAL_COMMUNITY): Payer: Self-pay | Admitting: Internal Medicine

## 2020-04-05 ENCOUNTER — Other Ambulatory Visit: Payer: Self-pay | Admitting: Internal Medicine

## 2020-04-05 DIAGNOSIS — E049 Nontoxic goiter, unspecified: Secondary | ICD-10-CM

## 2020-04-10 ENCOUNTER — Ambulatory Visit
Admission: RE | Admit: 2020-04-10 | Discharge: 2020-04-10 | Disposition: A | Discharge status: Home / Self Care | Payer: No Typology Code available for payment source | Source: Ambulatory Visit | Attending: Internal Medicine | Admitting: Internal Medicine

## 2020-04-10 ENCOUNTER — Encounter (INDEPENDENT_AMBULATORY_CARE_PROVIDER_SITE_OTHER): Payer: Self-pay

## 2020-04-10 ENCOUNTER — Other Ambulatory Visit: Payer: Self-pay

## 2020-04-10 DIAGNOSIS — E049 Nontoxic goiter, unspecified: Secondary | ICD-10-CM | POA: Insufficient documentation

## 2022-04-29 IMAGING — US US THYROID
1 series · 14 of 25 positions shown · non-contrast
Comparison: None.

CLINICAL DATA: Goiter.

EXAM:
THYROID ULTRASOUND
TECHNIQUE: Ultrasound examination of the thyroid gland and adjacent soft
tissues was performed.

[Series 1: us thyroid · 0.07mm/px · 14 of 42 slices shown]
[im 1/42]
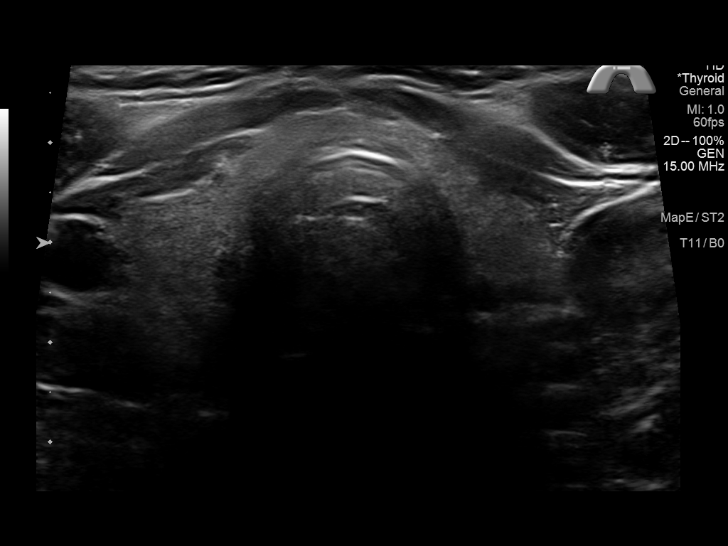
[im 4/42]
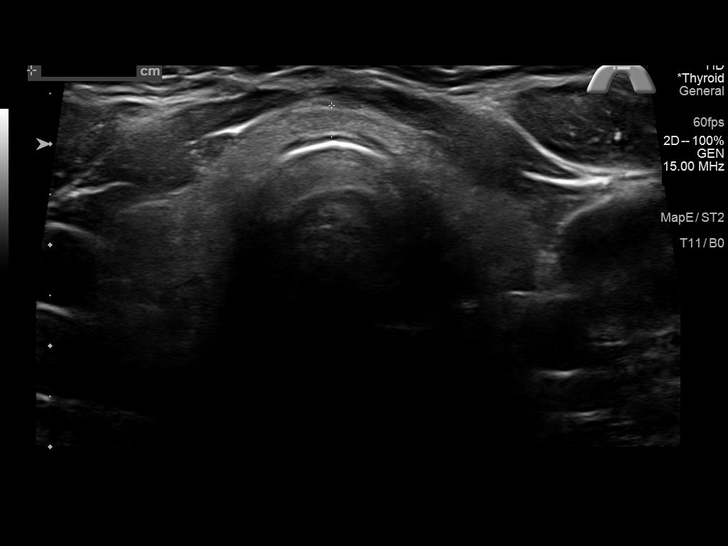
[im 7/42]
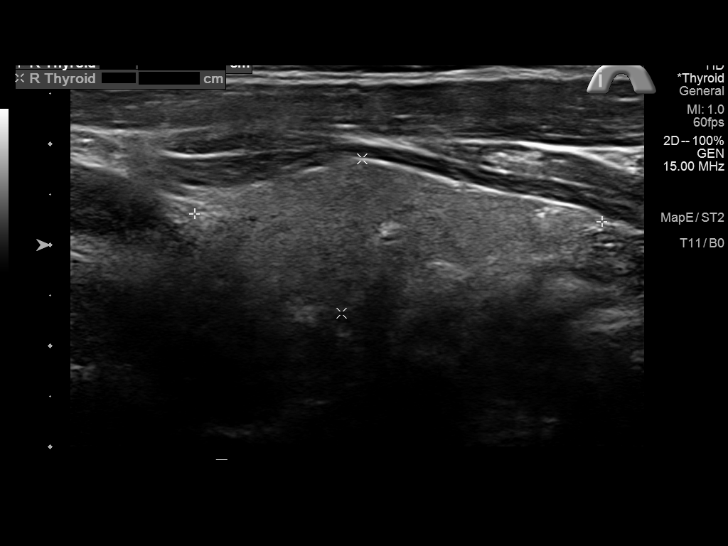
[im 11/42]
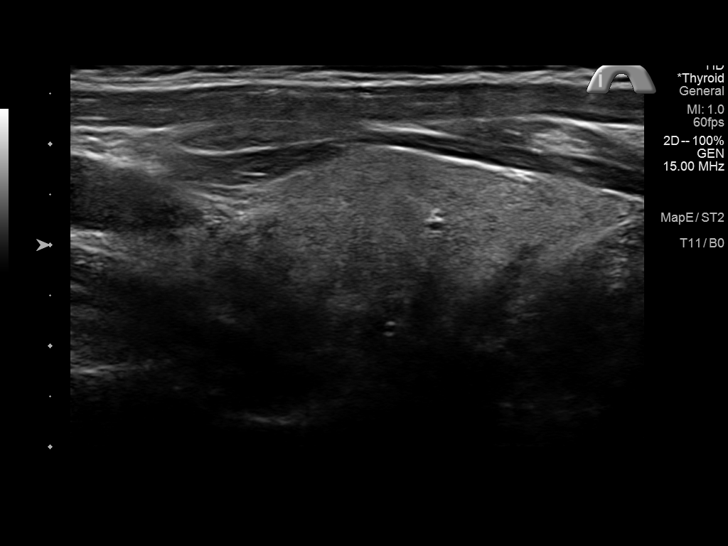
[im 14/42]
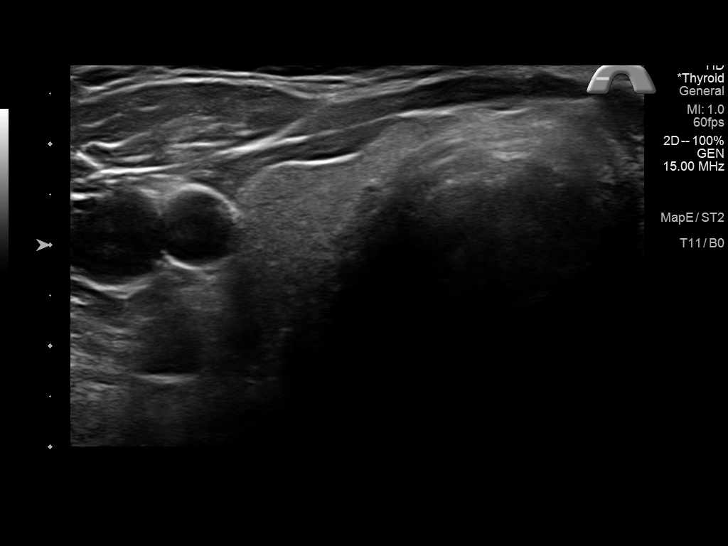
[im 16/42]
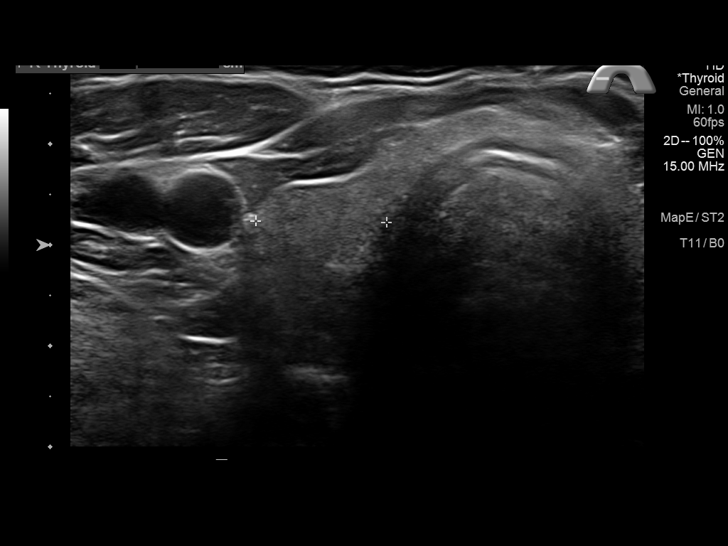
[im 19/42]
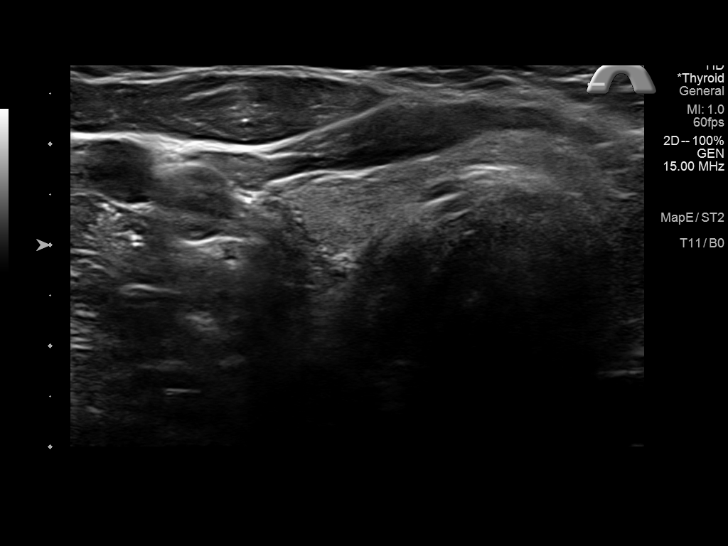
[im 23/42]
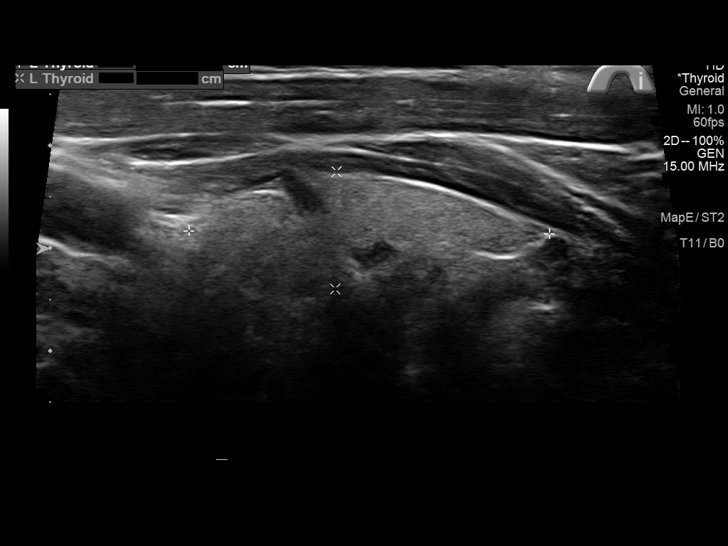
[im 26/42]
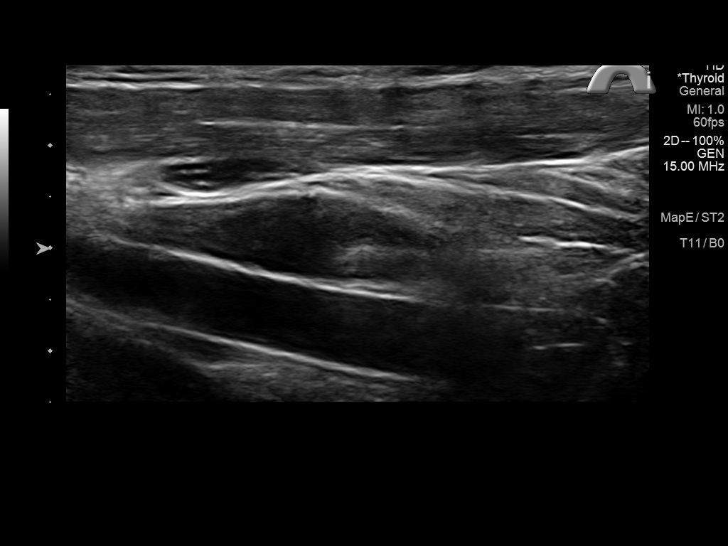
[im 28/42]
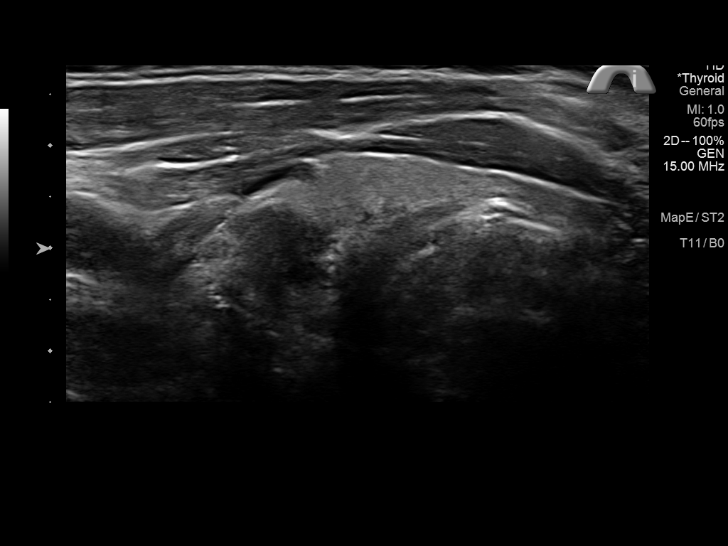
[im 31/42]
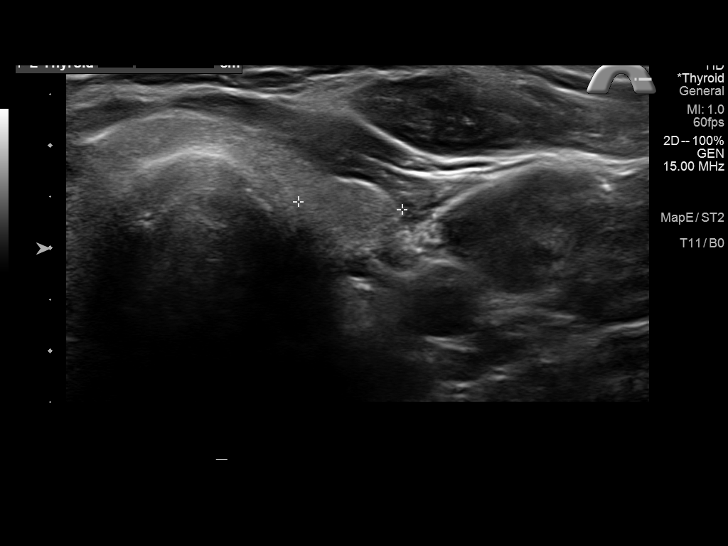
[im 35/42]
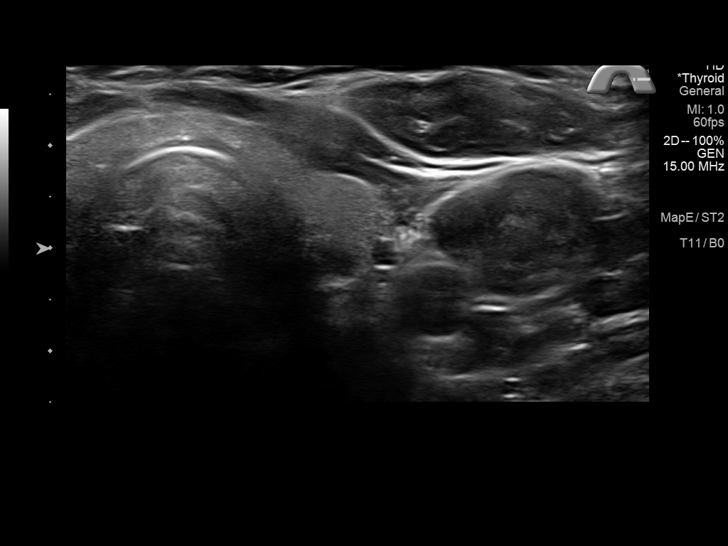
[im 38/42]
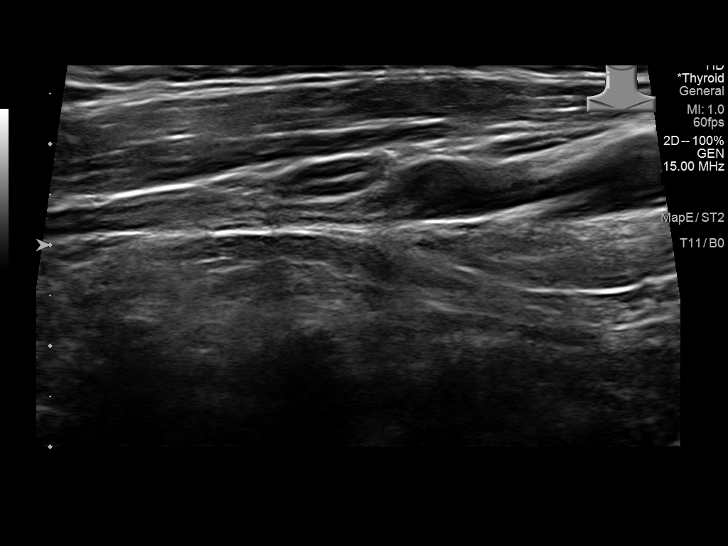
[im 42/42]
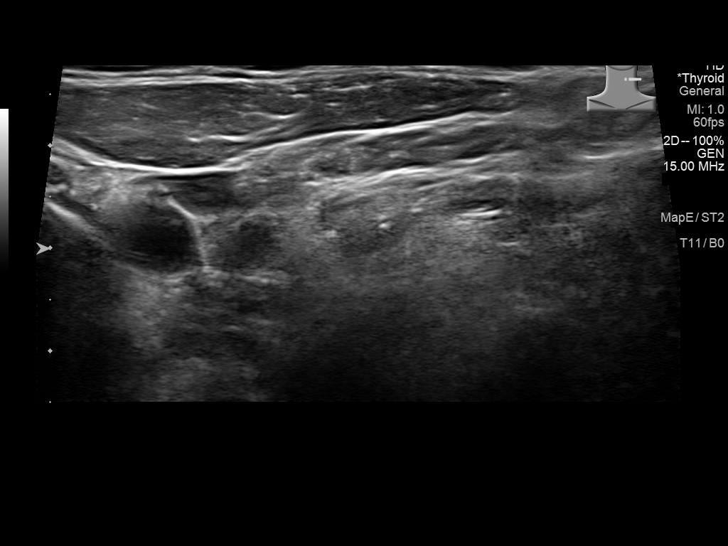

[14 of 25 positions shown; findings below may reference images not displayed]

FINDINGS: Parenchymal Echotexture: Normal

Isthmus: 0.3 cm

Right lobe: 4.0 x 1.5 x 1.3 cm

Left lobe: 3.5 x 1.1 x 1.0 cm

_________________________________________________________

Estimated total number of nodules >/= 1 cm: 0

Number of spongiform nodules >/=  2 cm not described below (TR1): 0

Number of mixed cystic and solid nodules >/= 1.5 cm not described
below (TR2): 0

_________________________________________________________

No discrete nodules are seen within the thyroid gland. No abnormal
lymph nodes identified.
IMPRESSION: Normal thyroid ultrasound.

The above is in keeping with the ACR TI-RADS recommendations - [HOSPITAL] 7265;[DATE].

## 2022-09-17 DIAGNOSIS — E039 Hypothyroidism, unspecified: Secondary | ICD-10-CM | POA: Diagnosis not present

## 2022-09-17 DIAGNOSIS — I10 Essential (primary) hypertension: Secondary | ICD-10-CM | POA: Diagnosis not present

## 2022-09-17 DIAGNOSIS — G4730 Sleep apnea, unspecified: Secondary | ICD-10-CM | POA: Diagnosis not present

## 2022-09-17 DIAGNOSIS — Z87891 Personal history of nicotine dependence: Secondary | ICD-10-CM | POA: Diagnosis not present

## 2022-09-17 DIAGNOSIS — N529 Male erectile dysfunction, unspecified: Secondary | ICD-10-CM | POA: Diagnosis not present

## 2022-09-17 DIAGNOSIS — K219 Gastro-esophageal reflux disease without esophagitis: Secondary | ICD-10-CM | POA: Diagnosis not present

## 2023-12-09 DIAGNOSIS — Z20828 Contact with and (suspected) exposure to other viral communicable diseases: Secondary | ICD-10-CM | POA: Diagnosis not present
